# Patient Record
Sex: Male | Born: 2000 | Hispanic: Yes | Marital: Married | Smoking: Never smoker
Health system: Southern US, Community
[De-identification: ages and names within clinical notes are randomized; demographics above are authoritative.]

## PROBLEM LIST (undated history)

## (undated) ENCOUNTER — Ambulatory Visit: Source: Home / Self Care

---
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
## 2021-06-16 DIAGNOSIS — H5213 Myopia, bilateral: Secondary | ICD-10-CM | POA: Diagnosis not present

## 2021-12-23 ENCOUNTER — Encounter: Payer: Self-pay | Admitting: Student

## 2021-12-23 ENCOUNTER — Other Ambulatory Visit: Payer: Self-pay

## 2021-12-23 ENCOUNTER — Ambulatory Visit: Payer: Medicaid Other | Admitting: Student

## 2021-12-23 VITALS — BP 135/86 | HR 84 | Temp 98.2°F | Ht 68.0 in | Wt 213.6 lb

## 2021-12-23 DIAGNOSIS — L219 Seborrheic dermatitis, unspecified: Secondary | ICD-10-CM

## 2021-12-23 MED ORDER — KETOCONAZOLE 2 % EX SHAM: 1.0000 | MEDICATED_SHAMPOO | CUTANEOUS | 0 refills | Status: DC

## 2021-12-23 MED ORDER — CICLOPIROX 1 % EX SHAM: MEDICATED_SHAMPOO | CUTANEOUS | 0 refills | Status: DC

## 2021-12-23 NOTE — Assessment & Plan Note (Signed)
Since 2nd grade. Comes and goes. Affects scalp and hairline, and in between the eyebrows. Heat seems to irritate the condition. Was given a medicated shampoo several years ago, helped for a couple of days but condition returned while using the shampoo. No systemic meds for this in the past. Sometimes scalp itches. No increased hair loss. No other dry patches.  Exam notable for a fine greasy scale that flakes off the scalp and patches, without erythema.  21 year old male in good health presents with several years of dandruff from scalp, no other symptoms, no other rashes on skin.  Likely seborrheic dermatitis.  Do not think this is psoriasis or some other more threatening skin rash.  For this patient's condition I will prescribe a course of ketoconazole shampoo in addition to ciclopirox shampoo. - Treat the scalp every other day using ketoconazole shampoo and ciclopirox shampoo in an alternating fashion for 4 weeks. - Patient was instructed to call if symptoms do not improve after 4-week course of treatment.  At that point may consider second line agents like topical corticosteroids.

## 2021-12-23 NOTE — Patient Instructions (Signed)
Today we discussed dandruff.  I believe your dandruff is caused by a condition called seborrheic dermatitis.  This is a very common condition caused by a fungus that lives on the skin.  This fungus is usually present on everyone skin, however in some people it can overgrow, causing the symptoms that we know it is dandruff.  I recommend using medicated shampoo to treat your condition.  I have ordered 2 different antifungal shampoos.  Use them in an alternating fashion, once or twice weekly (for total of 3-4 treatments per week).  If your symptoms do not improve after 4 weeks, call the clinic and we will consider the next option to treat your symptoms.  Return to the clinic in 1 year for a follow-up visit.    Please call our clinic at [phone] Monday through Friday from 9 am to 4 pm if you have questions or concerns about your health. If after hours or on the weekend, call the main hospital number and ask for the Internal Medicine Resident On-Call. If you need medication refills, please notify your pharmacy one week in advance and they will send [place] a request.   Best, Nani Gasser, [place]

## 2021-12-23 NOTE — Progress Notes (Signed)
Subjective:  Reason for visit: Dandruff  HPI:  [name] is a 21 y.o. male, healthy and without chronic medical problems, who presents for evaluation and treatment of chronic dandruff. Please see problem based assessment and plan for additional details.  No past medical history on file.  Current Outpatient Medications on File Prior to Visit  Medication Sig Dispense Refill   BENZACLIN gel Apply topically 2 (two) times daily. 50 g 11   ranitidine (ZANTAC) 150 MG tablet Take 1 tablet (150 mg total) by mouth 2 (two) times daily. 60 tablet 0   No current facility-administered medications on file prior to visit.    Family History  Problem Relation Age of Onset   Hypertension Mother    Diabetes Paternal Grandmother    Diabetes Paternal Grandfather     Social History   Socioeconomic History   Marital status: Single    Spouse name: Not on file   Number of children: Not on file   Years of education: Not on file   Highest education level: Not on file  Occupational History   Not on file  Tobacco Use   Smoking status: Never   Smokeless tobacco: Never  Substance and Sexual Activity   Alcohol use: No   Drug use: No   Sexual activity: Never  Other Topics Concern   Not on file  Social History Narrative   Student at [employer]   Social Determinants of Health   Financial Resource Strain: Not on file  Food Insecurity: Not on file  Transportation Needs: Not on file  Physical Activity: Not on file  Stress: Not on file  Social Connections: Not on file  Intimate Partner Violence: Not on file  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  He has a girlfriend.  He enjoys going to the gym and playing tabletop games with his friends.  Review of Systems: ROS negative except for what is noted on the assessment and plan.  Objective:   Vitals:   12/23/21 1058  BP: 135/86  Pulse:  84  Temp: 98.2 F (36.8 C)  TempSrc: Oral  SpO2: 100%  Weight: 213 lb 9.6 oz (96.9 kg)  Height: 5\' 8"  (1.727 m)    Physical Exam Constitutional:      General: He is not in acute distress.    Appearance: Normal appearance.  Cardiovascular:     Rate and Rhythm: Normal rate and regular rhythm.     Pulses: Normal pulses.  Pulmonary:     Effort: Pulmonary effort is normal.     Breath sounds: Normal breath sounds. No stridor.  Musculoskeletal:     Right lower leg: No edema.     Left lower leg: No edema.  Lymphadenopathy:     Cervical: No cervical adenopathy.  Skin:    General: Skin is warm and dry.     Findings: Rash present. Rash is scaling (Fine scale flaking from various spots on scalp, near hairline on forehead. Without significant erythema.).  Neurological:     Mental Status: He is alert. Mental status is at baseline.  Psychiatric:        Mood and Affect: Mood normal.        Behavior: Behavior normal.       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 Comes and goes. Affects scalp and hairline, and in between the eyebrows.  Heat seems to irritate the condition. Was given a medicated shampoo several years ago, helped for a couple of days but condition returned while using the shampoo. No systemic meds for this in the past. Sometimes scalp itches. No increased hair loss. No other dry patches.  Exam notable for a fine greasy scale that flakes off the scalp and patches, without erythema.  21 year old male in good health presents with several years of dandruff from scalp, no other symptoms, no other rashes on skin.  Likely seborrheic dermatitis.  Do not think this is psoriasis or some other more threatening skin rash.  For this patient's condition I will prescribe a course of ketoconazole shampoo in addition to ciclopirox shampoo. - Treat the scalp every other day using ketoconazole shampoo and ciclopirox shampoo in an alternating fashion for 4 weeks. -  Patient was instructed to call if symptoms do not improve after 4-week course of treatment.  At that point may consider second line agents like topical corticosteroids.  Follow up in 4 weeks.  Patient seen with Dr. Loree Fee, M.D. [facility]  PGY-1 Pager: [phone] Date 12/23/2021  Time 8:52 PM

## 2021-12-25 NOTE — Progress Notes (Signed)
Internal Medicine Clinic Attending  I saw and evaluated the patient.  I personally confirmed the key portions of the history and exam documented by Dr. McLendon and I reviewed pertinent patient test results.  The assessment, diagnosis, and plan were formulated together and I agree with the documentation in the resident's note.  

## 2022-05-27 ENCOUNTER — Telehealth: Payer: Medicaid Other | Admitting: Family Medicine

## 2022-05-27 DIAGNOSIS — L309 Dermatitis, unspecified: Secondary | ICD-10-CM | POA: Diagnosis not present

## 2022-05-27 MED ORDER — AQUAPHOR EX OINT: TOPICAL_OINTMENT | CUTANEOUS | 0 refills | Status: DC | PRN

## 2022-05-27 MED ORDER — PREDNISONE 10 MG (21) PO TBPK: ORAL_TABLET | ORAL | 0 refills | Status: DC

## 2022-05-27 MED ORDER — TERBINAFINE HCL 1 % EX CREA: 1.0000 | TOPICAL_CREAM | Freq: Two times a day (BID) | CUTANEOUS | 0 refills | Status: DC

## 2022-05-27 NOTE — Progress Notes (Signed)
E Visit for Rash  We are sorry that you are not feeling well. Here is how we plan to help!  You appear to have eczema and or fungal (foot) I have ordered a prednisone dose pack for you to take.  I also ordered a cream for your feet- apply as directed.  I also ordered Aquaphor- apply this daily after a bath to lock in moisture. You can also try Aveeno or Cerva or Cetaphil creams.   HOME CARE:  Take cool showers and avoid direct sunlight. Apply cool compress or wet dressings. Take a bath in an oatmeal bath.  Sprinkle content of one Aveeno packet under running faucet with comfortably warm water.  Bathe for 15-20 minutes, 1-2 times daily.  Pat dry with a towel. Do not rub the rash. Use hydrocortisone cream. Take an antihistamine like Benadryl for widespread rashes that itch.  The adult dose of Benadryl is 25-50 mg by mouth 4 times daily. Caution:  This type of medication may cause sleepiness.  Do not drink alcohol, drive, or operate dangerous machinery while taking antihistamines.  Do not take these medications if you have prostate enlargement.  Read package instructions thoroughly on all medications that you take.  GET HELP RIGHT AWAY IF:  Symptoms don't go away after treatment. Severe itching that persists. If you rash spreads or swells. If you rash begins to smell. If it blisters and opens or develops a yellow-brown crust. You develop a fever. You have a sore throat. You become short of breath.  MAKE SURE YOU:  Understand these instructions. Will watch your condition. Will get help right away if you are not doing well or get worse.  Thank you for choosing an e-visit.  Your e-visit answers were reviewed by a board certified advanced clinical practitioner to complete your personal care plan. Depending upon the condition, your plan could have included both over the counter or prescription medications.  Please review your pharmacy choice. Make sure the pharmacy is open so you can  pick up prescription now. If there is a problem, you may contact your provider through [employer] and have the prescription routed to another pharmacy.  Your safety is important to [place]. If you have drug allergies check your prescription carefully.   For the next 24 hours you can use MyChart to ask questions about today's visit, request a non-urgent call back, or ask for a work or school excuse. You will get an email in the next two days asking about your experience. I hope that your e-visit has been valuable and will speed your recovery.   I provided 5 minutes of non face-to-face time during this encounter for chart review, medication and order placement, as well as and documentation.

## 2023-05-27 ENCOUNTER — Ambulatory Visit: Payer: 59 | Admitting: Physician Assistant

## 2023-06-09 NOTE — Progress Notes (Unsigned)
 New patient visit   Patient: Harold Hanson   DOB: Apr 09, 2000   22 y.o. Male  MRN: [id number] Visit Date: 06/10/2023  Today's healthcare provider: Alfredia Ferguson, PA-C  Cc. New patient, cpe  Subjective    Harold Hanson is a 23 y.o. male who presents today as a new patient to establish care.  Discussed the use of AI scribe software for clinical note transcription with the patient, who gave verbal consent to proceed.  History of Present Illness   The patient, with a family history of diabetes, presents for a general checkup and preconception counseling. He has been out of the healthcare system for a while and is planning to start a family later this year. He expresses concern about the potential risk of diabetes for his future child, given his family history and his wife's prediabetes. He denies any current medical issues.       History reviewed. No pertinent past medical history. History reviewed. No pertinent surgical history. Family Status  Relation Name Status   Mother  (Not Specified)   PGM  (Not Specified)   PGF  (Not Specified)  No partnership data on file   Family History  Problem Relation Age of Onset   Hypertension Mother    Diabetes Paternal Grandmother    Diabetes Paternal Grandfather    Social History   Socioeconomic History   Marital status: Married    Spouse name: Not on file   Number of children: Not on file   Years of education: Not on file   Highest education level: Bachelor's degree (e.g., BA, AB, BS)  Occupational History   Not on file  Tobacco Use   Smoking status: Never   Smokeless tobacco: Never  Substance and Sexual Activity   Alcohol use: No   Drug use: No   Sexual activity: Never  Other Topics Concern   Not on file  Social History Narrative   Student at [employer]   Social Drivers of Health   Financial Resource Strain: Low Risk  (06/10/2023)   Overall Financial Resource Strain (CARDIA)    Difficulty of Paying  Living Expenses: Not very hard  Food Insecurity: Food Insecurity Present (06/10/2023)   Hunger Vital Sign    Worried About Running Out of Food in the Last Year: Sometimes true    Ran Out of Food in the Last Year: Never true  Transportation Needs: No Transportation Needs (06/10/2023)   PRAPARE - Administrator, Civil Service (Medical): No    Lack of Transportation (Non-Medical): No  Physical Activity: Sufficiently Active (06/10/2023)   Exercise Vital Sign    Days of Exercise per Week: 5 days    Minutes of Exercise per Session: 120 min  Stress: No Stress Concern Present (06/10/2023)   [employer] of Occupational Health - Occupational Stress Questionnaire    Feeling of Stress : Only a little  Social Connections: Moderately Isolated (06/10/2023)   Social Connection and Isolation Panel [NHANES]    Frequency of Communication with Friends and Family: Twice a week    Frequency of Social Gatherings with Friends and Family: Once a week    Attends Religious Services: Never    Database administrator or Organizations: No    Attends Engineer, structural: Not on file    Marital Status: Married   Outpatient Medications Prior to Visit  Medication Sig   [DISCONTINUED] BENZACLIN gel Apply topically 2 (two) times daily. (Patient not taking: Reported on 06/10/2023)   [  DISCONTINUED] mineral oil-hydrophilic petrolatum (AQUAPHOR) ointment Apply topically as needed for dry skin. (Patient not taking: Reported on 06/10/2023)   [DISCONTINUED] predniSONE (STERAPRED UNI-PAK 21 TAB) 10 MG (21) TBPK tablet Take as directed (Patient not taking: Reported on 06/10/2023)   [DISCONTINUED] ranitidine (ZANTAC) 150 MG tablet Take 1 tablet (150 mg total) by mouth 2 (two) times daily.   [DISCONTINUED] terbinafine (LAMISIL) 1 % cream Apply 1 Application topically 2 (two) times daily. (Patient not taking: Reported on 06/10/2023)   No facility-administered medications prior to visit.   Allergies  Allergen Reactions    Amoxicillin Rash    Immunization History  Administered Date(s) Administered   DTaP 09/27/1999, 11/25/1999, 01/31/2000, 11/25/2000   Dtap, Unspecified 11/21/2003   Fluzone Influenza virus vaccine,trivalent (IIV3), split virus 02/20/2012   HIB, Unspecified 09/27/1999, 11/25/1999, 01/31/2000, 11/25/2000   HPV Quadrivalent 03/30/2013, 05/03/2014   Hep B, Unspecified July 15, 1999, 09/27/1999, 05/04/2000   Hepatitis A, Ped/Adol-2 Dose 05/23/2014, 05/29/2016   IPV 09/27/1999, 11/25/1999, 08/04/2000   Influenza, Seasonal, Injecte, Preservative Fre 05/23/2023   Influenza,inj,Quad PF,6+ Mos 03/30/2013, 05/03/2014, 02/21/2015, 05/29/2016, 05/20/2017   Influenza-Unspecified 03/11/2004   MMR 08/04/2000, 11/21/2003   Meningococcal Conjugate 05/03/2014   Meningococcal Mcv4o 05/29/2016   PFIZER(Purple Top)SARS-COV-2 Vaccination 06/18/2019, 07/09/2019   Pneumococcal Conjugate PCV 7 09/27/1999, 11/25/1999, 01/31/2000   Polio, Unspecified 11/21/2003   Tdap 09/18/2010, 06/10/2023   Varicella 05/23/2014    Health Maintenance  Topic Date Due   HIV Screening  Never done   Hepatitis C Screening  Never done   COVID-19 Vaccine (3 - 2024-25 season) 12/07/2022   DTaP/Tdap/Td (8 - Td or Tdap) 06/09/2033   INFLUENZA VACCINE  Completed   HPV VACCINES  Completed    Patient Care Team: Alfredia Ferguson, PA-C as PCP - General (Physician Assistant) Verne Carrow, MD as Attending Physician (Ophthalmology)  Review of Systems  Constitutional:  Negative for fatigue and fever.  Respiratory:  Negative for cough and shortness of breath.   Cardiovascular:  Negative for chest pain, palpitations and leg swelling.  Genitourinary:  Positive for frequency.  Neurological:  Negative for dizziness and headaches.        Objective    BP 118/70 (BP Location: Left Arm, Patient Position: Sitting, Cuff Size: Large)   Pulse 83   Ht 5\' 8"  (1.727 m)   Wt 225 lb (102.1 kg)   SpO2 98%   BMI 34.21 kg/m     Physical  Exam Constitutional:      General: He is awake.     Appearance: He is well-developed.  HENT:     Head: Normocephalic.     Right Ear: Tympanic membrane, ear canal and external ear normal.     Left Ear: Tympanic membrane, ear canal and external ear normal.     Nose: Nose normal. No congestion or rhinorrhea.     Mouth/Throat:     Mouth: Mucous membranes are moist.     Pharynx: No oropharyngeal exudate or posterior oropharyngeal erythema.  Eyes:     Pupils: Pupils are equal, round, and reactive to light.  Cardiovascular:     Rate and Rhythm: Normal rate and regular rhythm.     Heart sounds: Normal heart sounds.  Pulmonary:     Effort: Pulmonary effort is normal.     Breath sounds: Normal breath sounds.  Abdominal:     General: There is no distension.     Palpations: Abdomen is soft.     Tenderness: There is no abdominal tenderness. There is no guarding.  Musculoskeletal:     Cervical back: Normal range of motion.     Right lower leg: No edema.     Left lower leg: No edema.  Lymphadenopathy:     Cervical: No cervical adenopathy.  Skin:    General: Skin is warm.  Neurological:     Mental Status: He is alert and oriented to person, place, and time.  Psychiatric:        Attention and Perception: Attention normal.        Mood and Affect: Mood normal.        Speech: Speech normal.        Behavior: Behavior normal. Behavior is cooperative.     Depression Screen    06/10/2023    8:20 AM 12/23/2021   10:57 AM 09/20/2015    2:45 PM 08/09/2015    8:43 AM  PHQ 2/9 Scores  PHQ - 2 Score 0 0 0 0   No results found for any visits on 06/10/23.  Assessment & Plan     Annual physical exam -     CBC with Differential/Platelet -     Comprehensive metabolic panel -     Lipid panel -     Hemoglobin A1c -     VITAMIN D 25 Hydroxy (Vit-D Deficiency, Fractures) -     TSH  Encounter for hepatitis C screening test for low risk patient -     Hepatitis C antibody  Encounter for screening  for HIV -     HIV Antibody (routine testing w rflx)  Need for Tdap vaccination -     Tdap vaccine greater than or equal to 7yo IM    General Health Maintenance - Order blood work including A1c, cholesterol, liver function tests, kidney function tests, CBC - Perform baseline screenings for HIV and Hepatitis C -updated tetanus today General recommendations: --balanced diet high in fiber and protein, low in sugars, carbs, fats. --physical activity/exercise 20-30 minutes 3-5 times a week    Preconception Counseling Planning to start a family. Explained that if conception is not achieved within one year, we can refer to a fertility clinic  Return in about 1 year (around 06/09/2024) for CPE.    Alfredia Ferguson, PA-C  [facility] at [facility] ([place]) [phone] (phone) [phone] (fax)  [facility]

## 2023-06-10 ENCOUNTER — Ambulatory Visit (INDEPENDENT_AMBULATORY_CARE_PROVIDER_SITE_OTHER): Payer: 59 | Admitting: Physician Assistant

## 2023-06-10 VITALS — BP 118/70 | HR 83 | Ht 68.0 in | Wt 225.0 lb

## 2023-06-10 DIAGNOSIS — Z1159 Encounter for screening for other viral diseases: Secondary | ICD-10-CM

## 2023-06-10 DIAGNOSIS — Z23 Encounter for immunization: Secondary | ICD-10-CM

## 2023-06-10 DIAGNOSIS — Z114 Encounter for screening for human immunodeficiency virus [HIV]: Secondary | ICD-10-CM

## 2023-06-10 DIAGNOSIS — Z0000 Encounter for general adult medical examination without abnormal findings: Secondary | ICD-10-CM | POA: Diagnosis not present

## 2023-06-10 LAB — COMPREHENSIVE METABOLIC PANEL
ALT: 25 U/L (ref 0–53)
AST: 21 U/L (ref 0–37)
Albumin: 4.8 g/dL (ref 3.5–5.2)
Alkaline Phosphatase: 92 U/L (ref 39–117)
BUN: 22 mg/dL (ref 6–23)
CO2: 29 meq/L (ref 19–32)
Calcium: 9.7 mg/dL (ref 8.4–10.5)
Chloride: 103 meq/L (ref 96–112)
Creatinine, Ser: 1 mg/dL (ref 0.40–1.50)
GFR: 105.82 mL/min (ref >60.00)
Glucose, Bld: 94 mg/dL (ref 70–99)
Potassium: 4.1 meq/L (ref 3.5–5.1)
Sodium: 139 meq/L (ref 135–145)
Total Bilirubin: 0.8 mg/dL (ref 0.2–1.2)
Total Protein: 7.6 g/dL (ref 6.0–8.3)

## 2023-06-10 LAB — CBC WITH DIFFERENTIAL/PLATELET
Basophils Absolute: 0 10*3/uL (ref 0.0–0.1)
Basophils Relative: 0.6 % (ref 0.0–3.0)
Eosinophils Absolute: 0.1 10*3/uL (ref 0.0–0.7)
Eosinophils Relative: 1.8 % (ref 0.0–5.0)
HCT: 47.5 % (ref 39.0–52.0)
Hemoglobin: 15.8 g/dL (ref 13.0–17.0)
Lymphocytes Relative: 37 % (ref 12.0–46.0)
Lymphs Abs: 1.6 10*3/uL (ref 0.7–4.0)
MCHC: 33.4 g/dL (ref 30.0–36.0)
MCV: 88.2 fl (ref 78.0–100.0)
Monocytes Absolute: 0.3 10*3/uL (ref 0.1–1.0)
Monocytes Relative: 8.1 % (ref 3.0–12.0)
Neutro Abs: 2.3 10*3/uL (ref 1.4–7.7)
Neutrophils Relative %: 52.5 % (ref 43.0–77.0)
Platelets: 254 10*3/uL (ref 150.0–400.0)
RBC: 5.39 Mil/uL (ref 4.22–5.81)
RDW: 13.2 % (ref 11.5–15.5)
WBC: 4.3 10*3/uL (ref 4.0–10.5)

## 2023-06-10 LAB — LIPID PANEL
Cholesterol: 198 mg/dL (ref 0–200)
HDL: 51.3 mg/dL (ref >39.00)
LDL Cholesterol: 132 mg/dL — ABNORMAL HIGH (ref 0–99)
NonHDL: 146.46
Total CHOL/HDL Ratio: 4
Triglycerides: 72 mg/dL (ref 0.0–149.0)
VLDL: 14.4 mg/dL (ref 0.0–40.0)

## 2023-06-10 LAB — HEMOGLOBIN A1C: Hgb A1c MFr Bld: 5.1 % (ref 4.6–6.5)

## 2023-06-10 LAB — VITAMIN D 25 HYDROXY (VIT D DEFICIENCY, FRACTURES): VITD: 10.51 ng/mL — ABNORMAL LOW (ref 30.00–100.00)

## 2023-06-10 LAB — TSH: TSH: 1.84 u[IU]/mL (ref 0.35–5.50)

## 2023-06-11 ENCOUNTER — Encounter: Payer: Self-pay | Admitting: Physician Assistant

## 2023-06-11 ENCOUNTER — Other Ambulatory Visit: Payer: Self-pay | Admitting: Physician Assistant

## 2023-06-11 DIAGNOSIS — E559 Vitamin D deficiency, unspecified: Secondary | ICD-10-CM

## 2023-06-11 LAB — HEPATITIS C ANTIBODY: Hepatitis C Ab: NONREACTIVE

## 2023-06-11 LAB — HIV ANTIBODY (ROUTINE TESTING W REFLEX): HIV 1&2 Ab, 4th Generation: NONREACTIVE

## 2023-08-01 ENCOUNTER — Other Ambulatory Visit: Payer: Self-pay

## 2023-08-01 ENCOUNTER — Encounter (HOSPITAL_BASED_OUTPATIENT_CLINIC_OR_DEPARTMENT_OTHER): Payer: Self-pay | Admitting: Emergency Medicine

## 2023-08-01 ENCOUNTER — Emergency Department (HOSPITAL_BASED_OUTPATIENT_CLINIC_OR_DEPARTMENT_OTHER)

## 2023-08-01 ENCOUNTER — Emergency Department (HOSPITAL_BASED_OUTPATIENT_CLINIC_OR_DEPARTMENT_OTHER)
Admission: EM | Admit: 2023-08-01 | Discharge: 2023-08-01 | Disposition: A | Discharge status: Home / Self Care | Attending: Emergency Medicine | Admitting: Emergency Medicine

## 2023-08-01 DIAGNOSIS — R2241 Localized swelling, mass and lump, right lower limb: Secondary | ICD-10-CM | POA: Insufficient documentation

## 2023-08-01 DIAGNOSIS — S99811A Other specified injuries of right ankle, initial encounter: Secondary | ICD-10-CM | POA: Diagnosis not present

## 2023-08-01 DIAGNOSIS — M25571 Pain in right ankle and joints of right foot: Secondary | ICD-10-CM | POA: Insufficient documentation

## 2023-08-01 NOTE — Discharge Instructions (Addendum)
 Today you were seen for right ankle pain.  Please ice, compress, and elevate the affected area.  You may also use Tylenol and Motrin  as needed for pain and swelling.  Follow-up with your primary care if your symptoms persist for further evaluation and treatment thank you for letting us  treat you today. After reviewing your imaging, I feel you are safe to go home. Please follow up with your PCP in the next several days and provide them with your records from this visit. Return to the Emergency Room if pain becomes severe or symptoms worsen.

## 2023-08-01 NOTE — ED Provider Notes (Signed)
 [place] [facility]PARTMENT AT [facility] Provider Note   CSN: [id number] Arrival date & time: 08/01/23  1227     History  Chief Complaint  Patient presents with   Ankle Pain    Harold Hanson is a 23 y.o. male presents today after a right ankle supination injury while walking down some stairs.  Patient endorses pain and swelling.  Patient denies numbness, tingling, or weakness.  Patient denies any other injuries at this time.   Ankle Pain      Home Medications Prior to Admission medications   Not on File      Allergies    Amoxicillin    Review of Systems   Review of Systems  Musculoskeletal:  Positive for arthralgias.    Physical Exam Updated Vital Signs BP (!) 126/91 (BP Location: Left Arm)   Pulse 72   Temp 98.7 F (37.1 C) (Oral)   Resp 18   Ht 5\' 8"  (1.727 m)   Wt 102.1 kg   SpO2 99%   BMI 34.22 kg/m  Physical Exam Vitals and nursing note reviewed.  Constitutional:      General: He is not in acute distress.    Appearance: Normal appearance. He is well-developed. He is not ill-appearing.  HENT:     Head: Normocephalic and atraumatic.     Right Ear: External ear normal.     Left Ear: External ear normal.     Nose: Nose normal.  Eyes:     Extraocular Movements: Extraocular movements intact.     Conjunctiva/sclera: Conjunctivae normal.  Cardiovascular:     Rate and Rhythm: Normal rate and regular rhythm.     Pulses: Normal pulses.     Heart sounds: No murmur heard. Pulmonary:     Effort: Pulmonary effort is normal. No respiratory distress.     Breath sounds: Normal breath sounds.  Abdominal:     Palpations: Abdomen is soft.     Tenderness: There is no abdominal tenderness.  Musculoskeletal:        General: Swelling present.     Cervical back: Neck supple.     Comments: Moderate swelling to the lateral malleolus of the right ankle.  Patient with tenderness to palpation of the lateral malleolus.  No ecchymosis or deformity noted.   Patient denies tenderness to palpation of the medial malleolus, proximal fifth metatarsal, or talus.  Patient is neurovascularly intact with 2+ dorsalis pedis pulses.  Skin:    General: Skin is warm and dry.     Capillary Refill: Capillary refill takes less than 2 seconds.  Neurological:     General: No focal deficit present.     Mental Status: He is alert.  Psychiatric:        Mood and Affect: Mood normal.     ED Results / Procedures / Treatments   Labs (all labs ordered are listed, but only abnormal results are displayed) Labs Reviewed - No data to display  EKG None  Radiology DG Ankle Complete Right Result Date: 08/01/2023 CLINICAL DATA:  Pain and swelling laterally EXAM: RIGHT ANKLE - COMPLETE 3+ VIEW COMPARISON:  None Available. FINDINGS: Soft tissue swelling noted most pronounced laterally. Normal alignment without definite acute osseous finding, fracture, subluxation, dislocation, or large joint effusion. Suspect remote tiny avulsion of the medial malleolus. No joint abnormality or arthropathy. IMPRESSION: Soft tissue swelling without acute osseous finding. Electronically Signed   By: Melven Stable.  Shick M.D.   On: 08/01/2023 14:45    Procedures Procedures  Medications Ordered in ED Medications - No data to display  ED Course/ Medical Decision Making/ A&P                                 Medical Decision Making Amount and/or Complexity of Data Reviewed Radiology: ordered.   This patient presents to the ED for concern of right ankle injury differential diagnosis includes tibia fracture, fibular fracture, ankle sprain, musculoskeletal pain, fifth metatarsal fracture, talus fracture, calcaneus fracture, gout  Imaging Studies ordered:  I ordered imaging studies including right ankle x-ray I independently visualized and interpreted imaging which showed soft tissue swelling without acute osseous finding I agree with the radiologist interpretation   Problem List / ED  Course:  Considered for admission or further workup however patient's vital signs, physical exam, and imaging were reassuring.  Patient's symptoms likely due to musculoskeletal pain.  Patient given a walking boot for comfort and advised to ice, compress, elevate, and use Tylenol/Motrin  as needed for swelling and pain.  Patient to follow-up with primary care if the symptoms persist for further evaluation and workup.          Final Clinical Impression(s) / ED Diagnoses Final diagnoses:  Acute right ankle pain    Rx / DC Orders ED Discharge Orders     None         Carie Charity, PA-C 08/01/23 1449    Teddi Favors, DO 08/02/23 [phone]

## 2023-08-01 NOTE — ED Triage Notes (Signed)
 Pt with pain to RT ankle s/p rolling it today

## 2023-08-17 ENCOUNTER — Other Ambulatory Visit (INDEPENDENT_AMBULATORY_CARE_PROVIDER_SITE_OTHER)

## 2023-08-17 DIAGNOSIS — E559 Vitamin D deficiency, unspecified: Secondary | ICD-10-CM

## 2023-08-17 LAB — VITAMIN D 25 HYDROXY (VIT D DEFICIENCY, FRACTURES): VITD: 25.83 ng/mL — ABNORMAL LOW (ref 30.00–100.00)

## 2023-08-18 ENCOUNTER — Ambulatory Visit: Payer: Self-pay | Admitting: Physician Assistant

## 2023-09-23 ENCOUNTER — Emergency Department (HOSPITAL_BASED_OUTPATIENT_CLINIC_OR_DEPARTMENT_OTHER)
Admission: EM | Admit: 2023-09-23 | Discharge: 2023-09-23 | Disposition: A | Discharge status: Home / Self Care | Attending: Emergency Medicine | Admitting: Emergency Medicine

## 2023-09-23 ENCOUNTER — Encounter (HOSPITAL_BASED_OUTPATIENT_CLINIC_OR_DEPARTMENT_OTHER): Payer: Self-pay | Admitting: Emergency Medicine

## 2023-09-23 ENCOUNTER — Other Ambulatory Visit: Payer: Self-pay

## 2023-09-23 DIAGNOSIS — Z23 Encounter for immunization: Secondary | ICD-10-CM | POA: Diagnosis not present

## 2023-09-23 DIAGNOSIS — Y900 Blood alcohol level of less than 20 mg/100 ml: Secondary | ICD-10-CM | POA: Diagnosis not present

## 2023-09-23 DIAGNOSIS — W450XXA Nail entering through skin, initial encounter: Secondary | ICD-10-CM | POA: Insufficient documentation

## 2023-09-23 DIAGNOSIS — S01311A Laceration without foreign body of right ear, initial encounter: Secondary | ICD-10-CM | POA: Diagnosis not present

## 2023-09-23 MED ORDER — TETANUS-DIPHTH-ACELL PERTUSSIS 5-2.5-18.5 LF-MCG/0.5 IM SUSY
0.5000 mL | PREFILLED_SYRINGE | Freq: Once | INTRAMUSCULAR | Status: AC
  Administered 2023-09-23: 0.5 mL via INTRAMUSCULAR
  Filled 2023-09-23: qty 0.5

## 2023-09-23 MED ORDER — LIDOCAINE-EPINEPHRINE-TETRACAINE (LET) TOPICAL GEL
3.0000 mL | Freq: Once | TOPICAL | Status: AC
  Administered 2023-09-23: 3 mL via TOPICAL
  Filled 2023-09-23: qty 3

## 2023-09-23 MED ORDER — CIPROFLOXACIN HCL 500 MG PO TABS: 500.0000 mg | ORAL_TABLET | Freq: Two times a day (BID) | ORAL | 0 refills | Status: AC

## 2023-09-23 NOTE — ED Triage Notes (Signed)
 Pt POV- reports was moving a pallet with exposed nail, now has laceration to R earlobe, happened appx 30 min PTA.   Unknown TDAP status.

## 2023-09-23 NOTE — ED Notes (Signed)
 Discharge instructions reviewed with patient. Patient verbalizes understanding, no further questions at this time. Medications/prescriptions and follow up information provided. No acute distress noted at time of departure.

## 2023-09-23 NOTE — ED Provider Notes (Signed)
  [place] EMER[facility]MENT AT [facility] Provider Note   CSN: [id number] Arrival date & time: 09/23/23  1615     Patient presents with: Ear Injury   Dennis Hegeman is a 23 y.o. male.   This is a 23 year old male here today with pain to his right ear.  He was moving a pallet at work, there is an exposed nail and it tore his right earlobe.  Not up-to-date on tetanus.        Prior to Admission medications   Medication Sig Start Date End Date Taking? Authorizing Provider  ciprofloxacin (CIPRO) 500 MG tablet Take 1 tablet (500 mg total) by mouth every 12 (twelve) hours for 3 days. 09/23/23 09/26/23 Yes Afton Horse T, DO    Allergies: Amoxicillin    Review of Systems  Updated Vital Signs BP 139/73 (BP Location: Right Arm)   Pulse 86   Temp 98.7 F (37.1 C)   Resp 18   Ht 5' 8 (1.727 m)   Wt 108.9 kg   SpO2 99%   BMI 36.49 kg/m   Physical Exam Vitals and nursing note reviewed.  HENT:     Ears:     Comments: On the right lobe there is a stellate laceration measuring a total of 4.7 cm.  Skin:    General: Skin is warm.     (all labs ordered are listed, but only abnormal results are displayed) Labs Reviewed - No data to display  EKG: None  Radiology: No results found.   .Laceration Repair  Date/Time: 09/23/2023 5:50 PM  Performed by: Nathanael Baker, DO Authorized by: Afton Horse T, DO   Laceration details:    Length (cm):  4.7 Treatment:    Debridement:  None Repair type:    Repair type:  Intermediate Comments:     I anesthetized the area using let gel.  Irrigated the area using 100 cc of sterile saline.  Placed 12 simple interrupted sutures with good wound approximation.  There is a small area near the external auditory canal that there was some extruded tissue which was not able to be brought together.  Vicryl rapide, 5-0 used for suturing.    Medications Ordered in the ED  Tdap (BOOSTRIX) injection 0.5 mL (0.5 mLs  Intramuscular Given 09/23/23 1639)  lidocaine-EPINEPHrine-tetracaine (LET) topical gel (3 mLs Topical Given 09/23/23 1641)                                    Medical Decision Making 23 year old male here today with ear laceration.  Plan-laceration repaired.  Risk Prescription drug management.        Final diagnoses:  None    ED Discharge Orders          Ordered    ciprofloxacin (CIPRO) 500 MG tablet  Every 12 hours        09/23/23 1753               Afton Horse T, DO 09/23/23 1753

## 2023-09-23 NOTE — Discharge Instructions (Signed)
 I have sent you a prescription for an antibiotic called ciprofloxacin.  You can take that 2 times per day for the next 3 days.  Today, leave the dressing alone.  Tomorrow, you can take the dressing down and shower as usual.  Gently wash area soap and water.  When you finish your shower, and dry off, please apply another bolster dressing as instructed and do so for the next 48 hours.  Follow-up with your primary care doctor.  Return to the emergency room if you develop increasing pain, swelling, or noticed signs of infection at your ear.

## 2023-12-11 ENCOUNTER — Encounter: Payer: Self-pay | Admitting: Physician Assistant

## 2024-06-10 ENCOUNTER — Encounter: Admitting: Physician Assistant
